# Patient Record
Sex: Female | Born: 1935 | Race: White | Hispanic: No | Marital: Married
Health system: Southern US, Community
[De-identification: ages and names within clinical notes are randomized; demographics above are authoritative.]

---

## 1998-11-20 ENCOUNTER — Other Ambulatory Visit: Admission: RE | Admit: 1998-11-20 | Discharge: 1998-11-20 | Payer: Self-pay | Admitting: Family Medicine

## 2000-11-27 ENCOUNTER — Other Ambulatory Visit: Admission: RE | Admit: 2000-11-27 | Discharge: 2000-11-27 | Payer: Self-pay | Admitting: Family Medicine

## 2001-12-25 ENCOUNTER — Emergency Department (HOSPITAL_COMMUNITY): Admission: EM | Admit: 2001-12-25 | Discharge: 2001-12-25 | Payer: Self-pay

## 2003-07-10 ENCOUNTER — Other Ambulatory Visit: Admission: RE | Admit: 2003-07-10 | Discharge: 2003-07-10 | Payer: Self-pay | Admitting: Family Medicine

## 2004-02-09 ENCOUNTER — Ambulatory Visit (HOSPITAL_COMMUNITY): Admission: RE | Admit: 2004-02-09 | Discharge: 2004-02-09 | Payer: Self-pay | Admitting: Orthopaedic Surgery

## 2004-10-03 ENCOUNTER — Emergency Department (HOSPITAL_COMMUNITY): Admission: EM | Admit: 2004-10-03 | Discharge: 2004-10-04 | Payer: Self-pay | Admitting: Emergency Medicine

## 2006-09-20 ENCOUNTER — Emergency Department (HOSPITAL_COMMUNITY): Admission: EM | Admit: 2006-09-20 | Discharge: 2006-09-20 | Payer: Self-pay | Admitting: Emergency Medicine

## 2007-09-16 IMAGING — CR DG HIP (WITH OR WITHOUT PELVIS) 2-3V*L*
3 series · 3 of 3 positions shown · non-contrast
Comparison: Lumbar spine MRI of 02/09/04.

CLINICAL DATA: Low back and left hip pain.  
 LUMBAR SPINE ? 4 VIEW:

[t hip frog leg left]
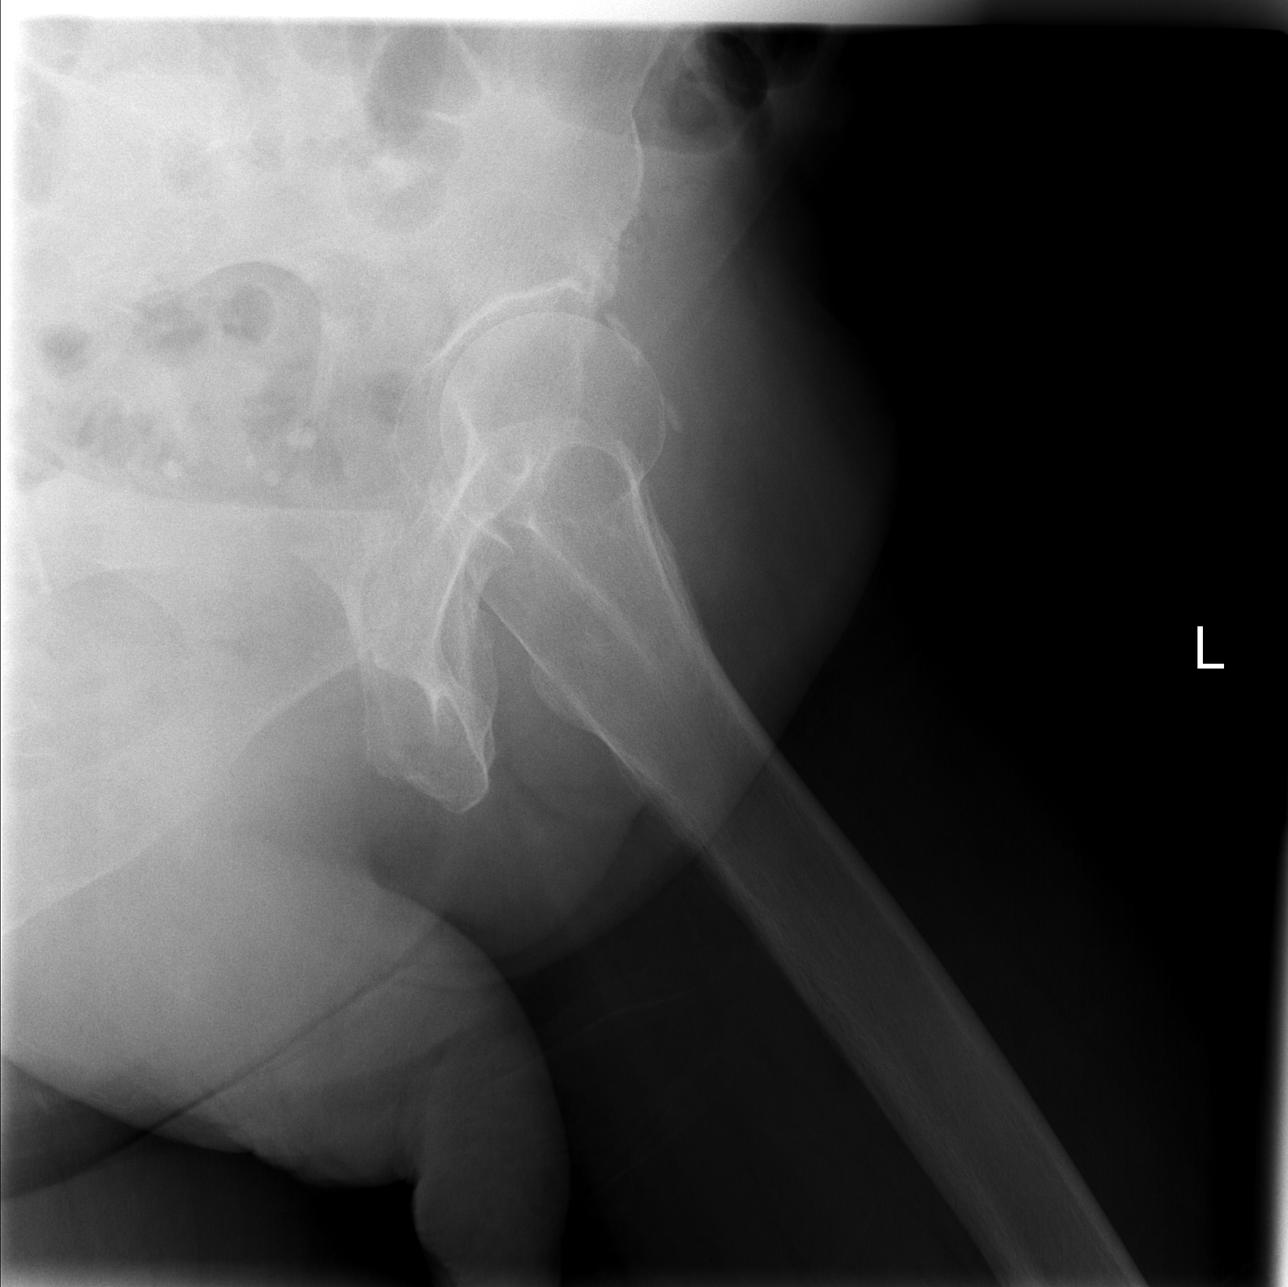

[t pelvis a.p.]
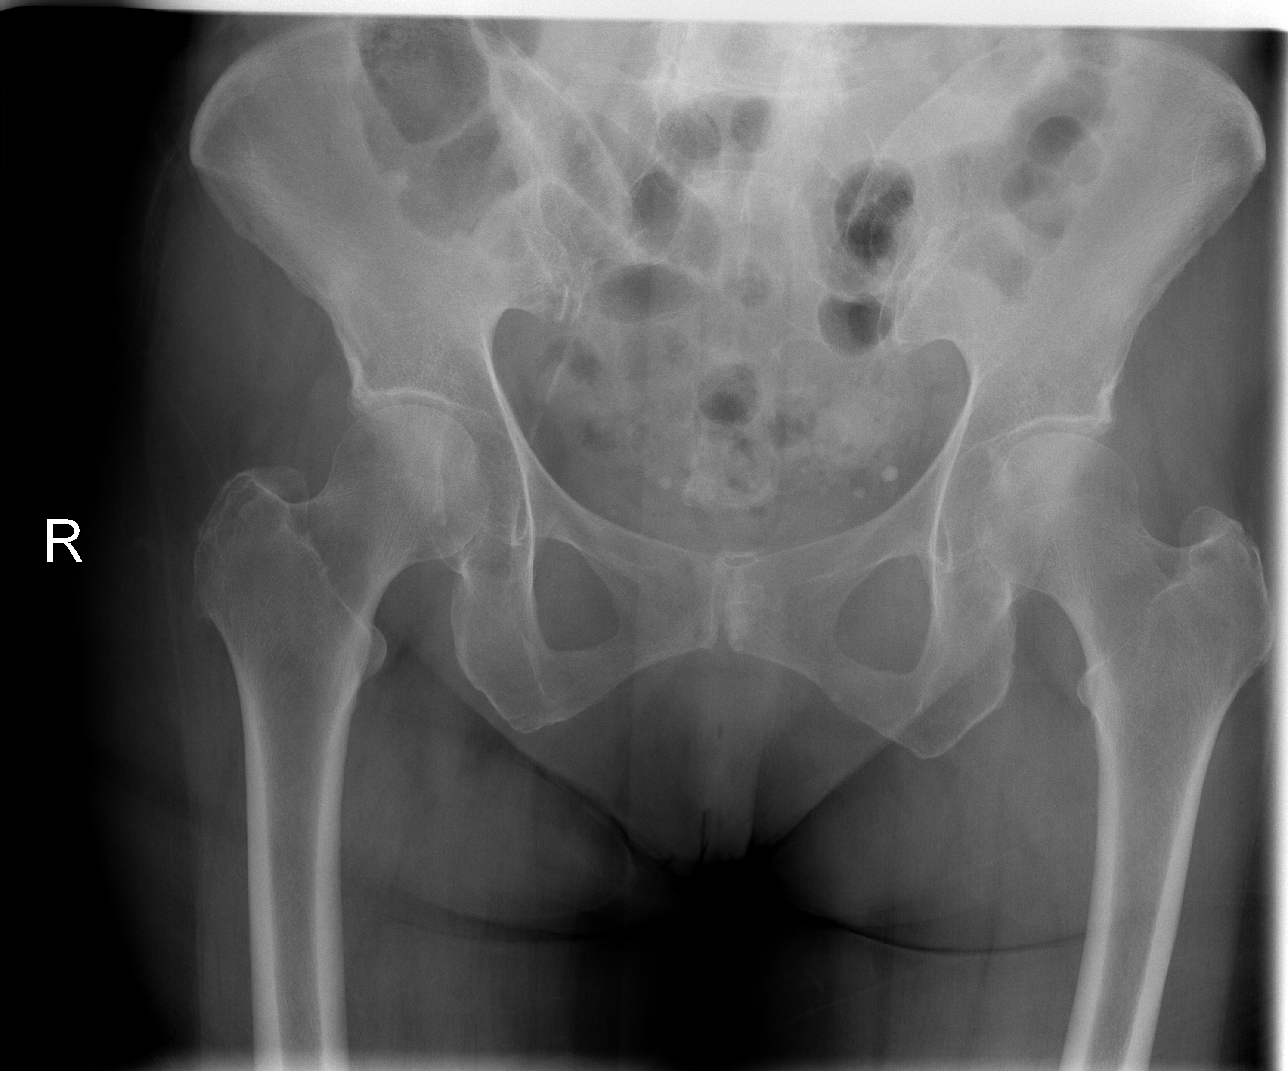

[t hip ap left]
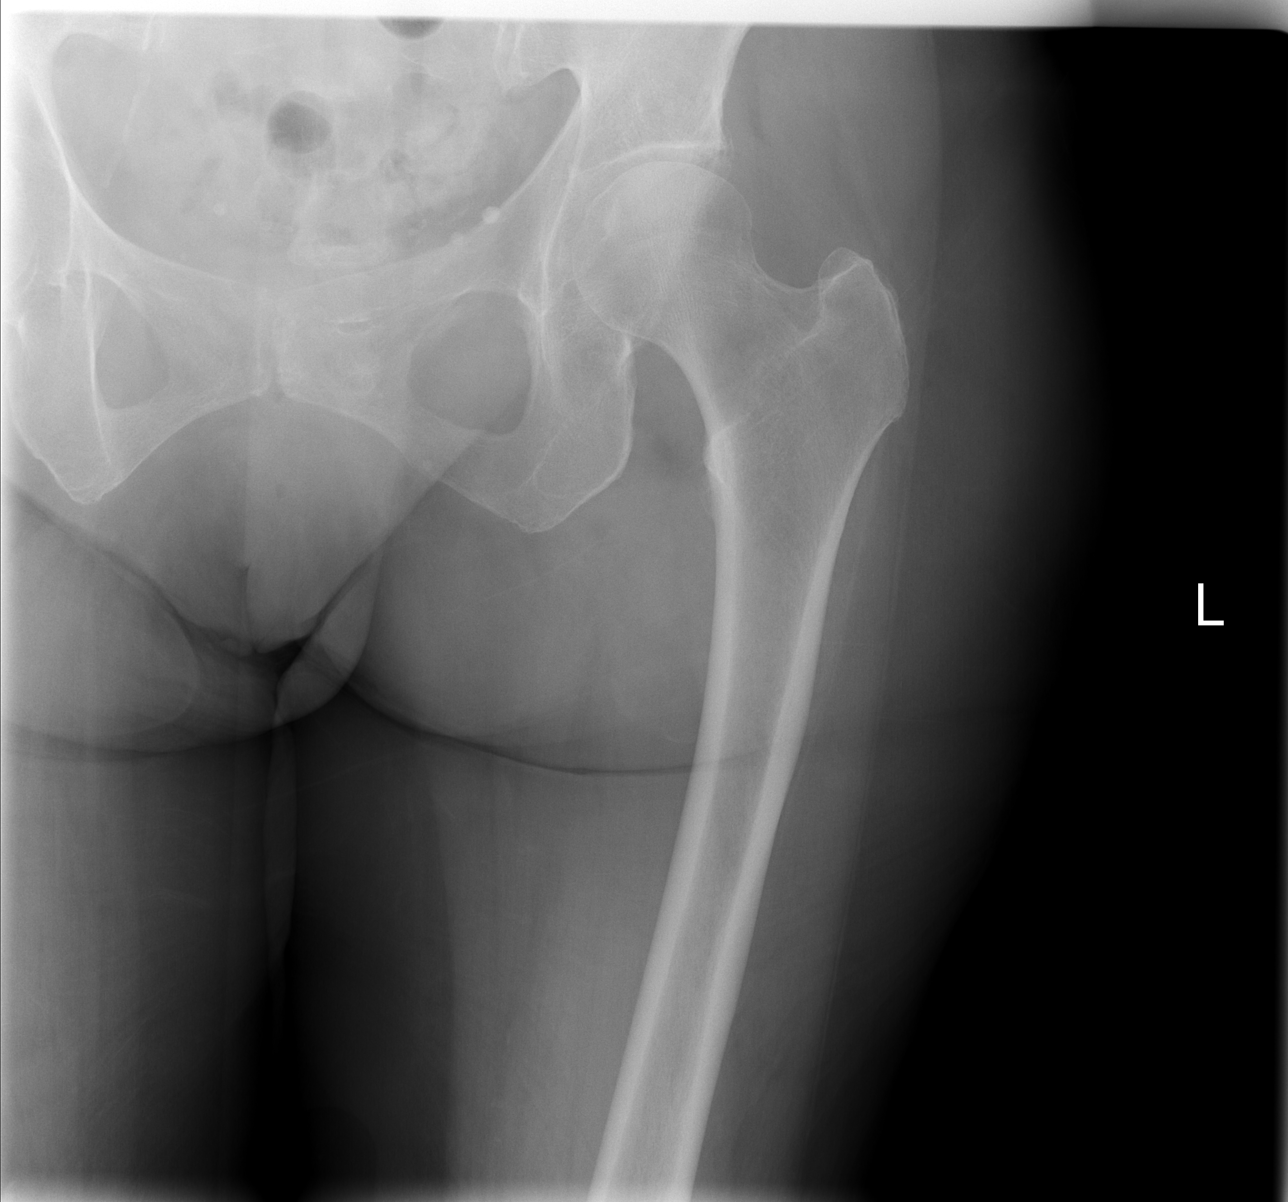

[3 of 3 positions shown; findings below may reference images not displayed]

FINDINGS: Convex left rotoscoliosis of the lumbar spine is redemonstrated with stable chronic anterior wedging of the L3 and L4 vertebral bodies.  The lateral alignment appears stable from the previous MRI.    Advanced degenerative disc disease is present at the lower 4 levels.   No acute fracture is demonstrated.
IMPRESSION: Stable chronic scoliosis and spondylosis compared with the previous MRI.  No acute findings.   
 LEFT HIP ? 2 VIEW:
FINDINGS: There is no evidence of acute fracture or dislocation.  The hip joint spaces appear relatively preserved for age.  Mild sacroiliac degenerative changes are present bilaterally.
IMPRESSION: No acute osseous findings.   If the patient has persistent unexplained hip pain, radiographic follow-up should be considered, as acute hip fractures can be radiographically occult in the elderly.

## 2014-01-20 ENCOUNTER — Telehealth: Payer: Self-pay

## 2014-01-20 NOTE — Telephone Encounter (Signed)
Patient past away in [place], [place] per Ileene Hutchinsonbituary in [employer]

## 2014-02-12 DEATH — deceased
